# Patient Record
Sex: Female | Born: 1945 | Race: Black or African American | Hispanic: No | Marital: Married
Health system: Southern US, Community
[De-identification: ages and names within clinical notes are randomized; demographics above are authoritative.]

---

## 2014-11-08 ENCOUNTER — Other Ambulatory Visit: Payer: Self-pay

## 2014-11-08 DIAGNOSIS — Z1231 Encounter for screening mammogram for malignant neoplasm of breast: Secondary | ICD-10-CM

## 2014-11-14 ENCOUNTER — Ambulatory Visit
Admission: RE | Admit: 2014-11-14 | Discharge: 2014-11-14 | Disposition: A | Discharge status: Home / Self Care | Payer: Medicare Other | Source: Ambulatory Visit

## 2014-11-14 DIAGNOSIS — Z1231 Encounter for screening mammogram for malignant neoplasm of breast: Secondary | ICD-10-CM

## 2015-09-12 ENCOUNTER — Other Ambulatory Visit: Payer: Self-pay | Admitting: Nurse Practitioner

## 2015-09-12 DIAGNOSIS — E2839 Other primary ovarian failure: Secondary | ICD-10-CM

## 2015-10-02 ENCOUNTER — Ambulatory Visit
Admission: RE | Admit: 2015-10-02 | Discharge: 2015-10-02 | Disposition: A | Discharge status: Home / Self Care | Payer: Medicare Other | Source: Ambulatory Visit | Attending: Nurse Practitioner | Admitting: Nurse Practitioner

## 2015-10-02 DIAGNOSIS — E2839 Other primary ovarian failure: Secondary | ICD-10-CM

## 2018-12-16 ENCOUNTER — Other Ambulatory Visit: Payer: Self-pay | Admitting: Family Medicine

## 2018-12-16 DIAGNOSIS — Z1231 Encounter for screening mammogram for malignant neoplasm of breast: Secondary | ICD-10-CM

## 2019-02-01 ENCOUNTER — Ambulatory Visit
Admission: RE | Admit: 2019-02-01 | Discharge: 2019-02-01 | Disposition: A | Discharge status: Home / Self Care | Payer: Medicare Other | Source: Ambulatory Visit | Attending: Family Medicine | Admitting: Family Medicine

## 2019-02-01 ENCOUNTER — Other Ambulatory Visit: Payer: Self-pay

## 2019-02-01 DIAGNOSIS — Z1231 Encounter for screening mammogram for malignant neoplasm of breast: Secondary | ICD-10-CM

## 2019-04-21 ENCOUNTER — Ambulatory Visit: Payer: Medicare Other

## 2019-04-29 ENCOUNTER — Ambulatory Visit: Payer: Medicare Other | Attending: Internal Medicine

## 2019-04-29 DIAGNOSIS — Z23 Encounter for immunization: Secondary | ICD-10-CM | POA: Insufficient documentation

## 2019-04-29 NOTE — Progress Notes (Signed)
   Covid-19 Vaccination Clinic  Name:  Sharon Carpenter    MRN: [id number] DOB: 1945/04/04  04/29/2019  [name] was observed post Covid-19 immunization for 15 minutes without incidence. She was provided with Vaccine Information Sheet and instruction to access the V-Safe system.   [name] was instructed to call 911 with any severe reactions post vaccine: Marland Kitchen Difficulty breathing  . Swelling of your face and throat  . A fast heartbeat  . A bad rash all over your body  . Dizziness and weakness    Immunizations Administered    Name Date Dose VIS Date Route   Pfizer COVID-19 Vaccine 04/29/2019  3:40 PM 0.3 mL 03/04/2019 Intramuscular   Manufacturer: [employer], [employer]   Lot: [id number]   NDC: [address]-9

## 2019-05-02 ENCOUNTER — Ambulatory Visit: Payer: Medicare Other

## 2019-05-23 ENCOUNTER — Ambulatory Visit: Payer: Medicare Other | Attending: Internal Medicine

## 2019-05-23 DIAGNOSIS — Z23 Encounter for immunization: Secondary | ICD-10-CM | POA: Insufficient documentation

## 2019-05-23 NOTE — Progress Notes (Signed)
   Covid-19 Vaccination Clinic  Name:  Sharon Carpenter    MRN: [id number] DOB: 1945/10/09  05/23/2019  Sharon Carpenter was observed post Covid-19 immunization for 15 minutes without incidence. She was provided with Vaccine Information Sheet and instruction to access the V-Safe system.   Sharon Carpenter was instructed to call 911 with any severe reactions post vaccine: Marland Kitchen Difficulty breathing  . Swelling of your face and throat  . A fast heartbeat  . A bad rash all over your body  . Dizziness and weakness    Immunizations Administered    Name Date Dose VIS Date Route   Pfizer COVID-19 Vaccine 05/23/2019 11:15 AM 0.3 mL 03/04/2019 Intramuscular   Manufacturer: [employer], [employer]   Lot: [id number]   NDC: [address]-0

## 2019-05-24 ENCOUNTER — Ambulatory Visit: Payer: Medicare Other

## 2020-09-19 IMAGING — MG DIGITAL SCREENING BILAT W/ TOMO W/ CAD
8 series · 8 of 24 positions shown · non-contrast
Comparison: Previous exam(s).

CLINICAL DATA: Screening.

EXAM:
DIGITAL SCREENING BILATERAL MAMMOGRAM WITH TOMO AND CAD

[R CC synth-2D]
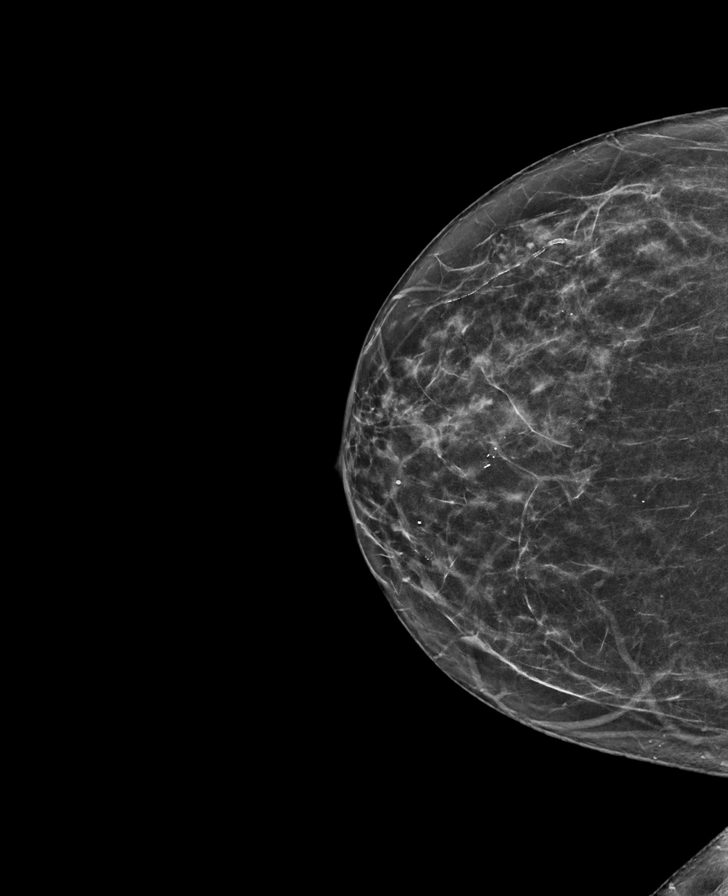

[L CC synth-2D]
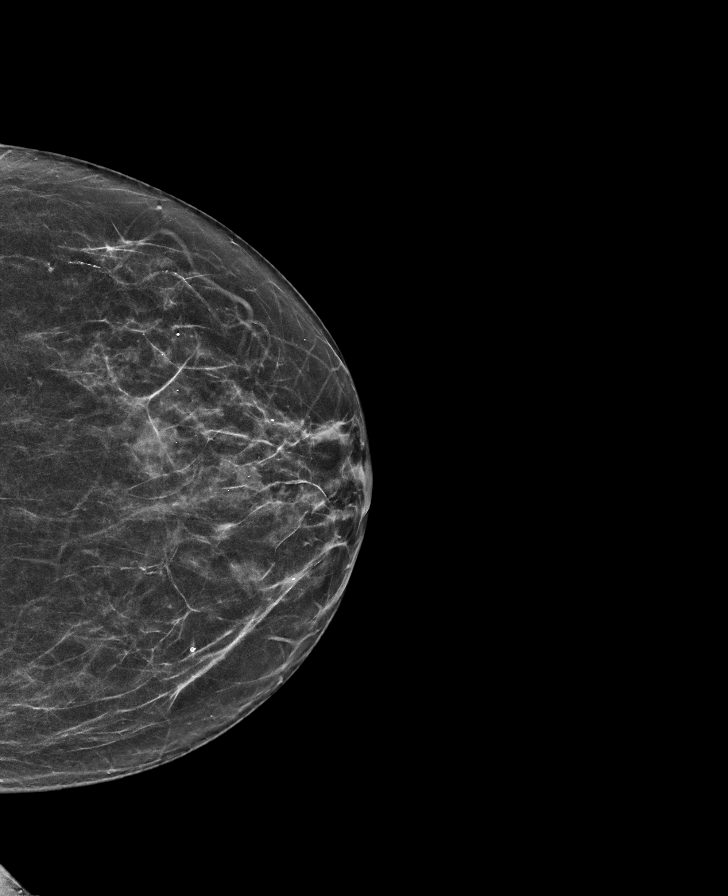

[L MLO synth-2D]
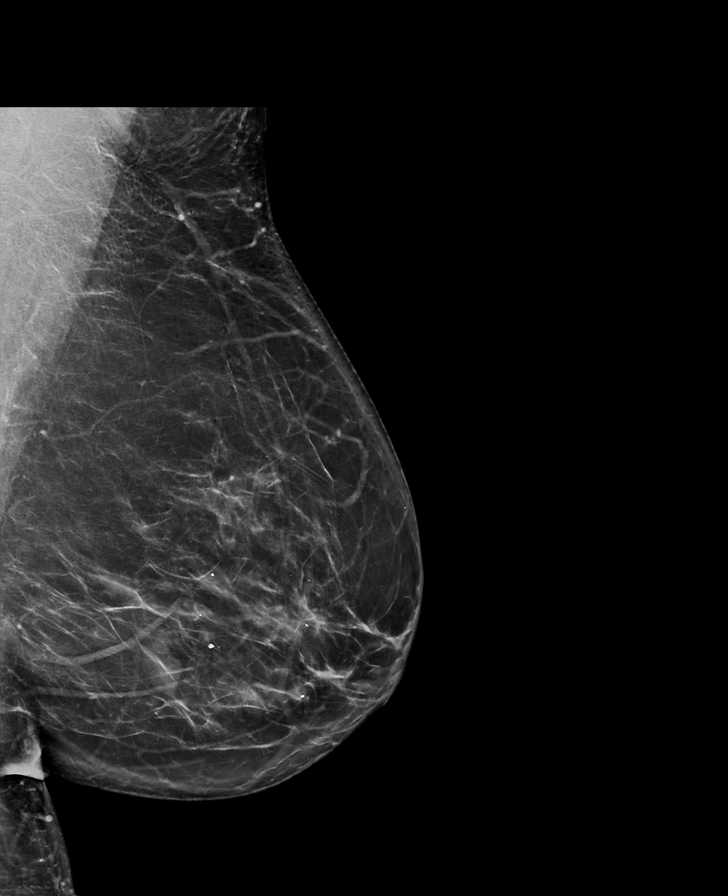

[R MLO synth-2D]
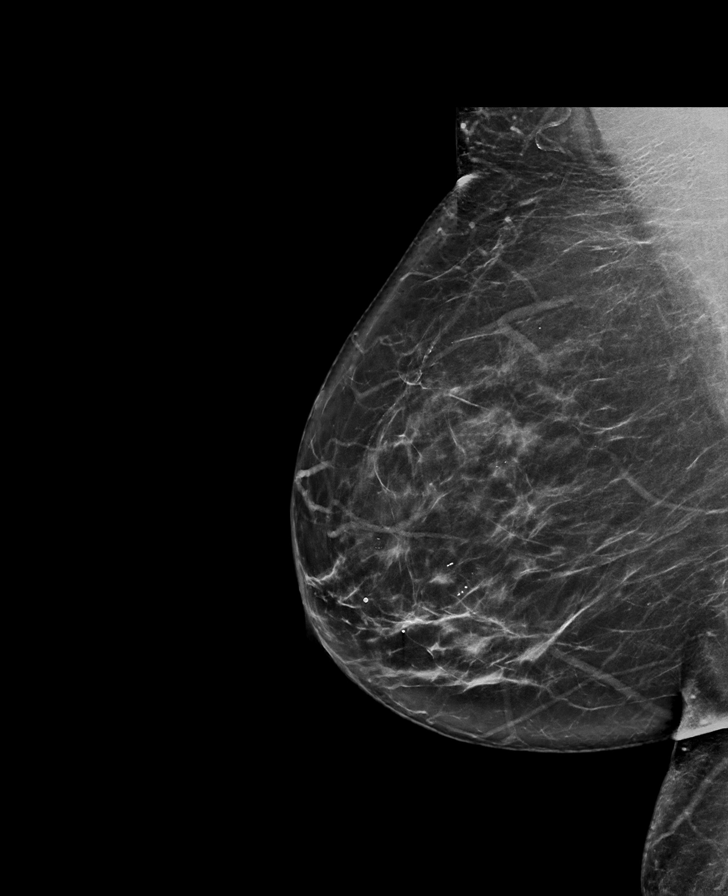

[R CC tomo · tomo slice 33/64.0]
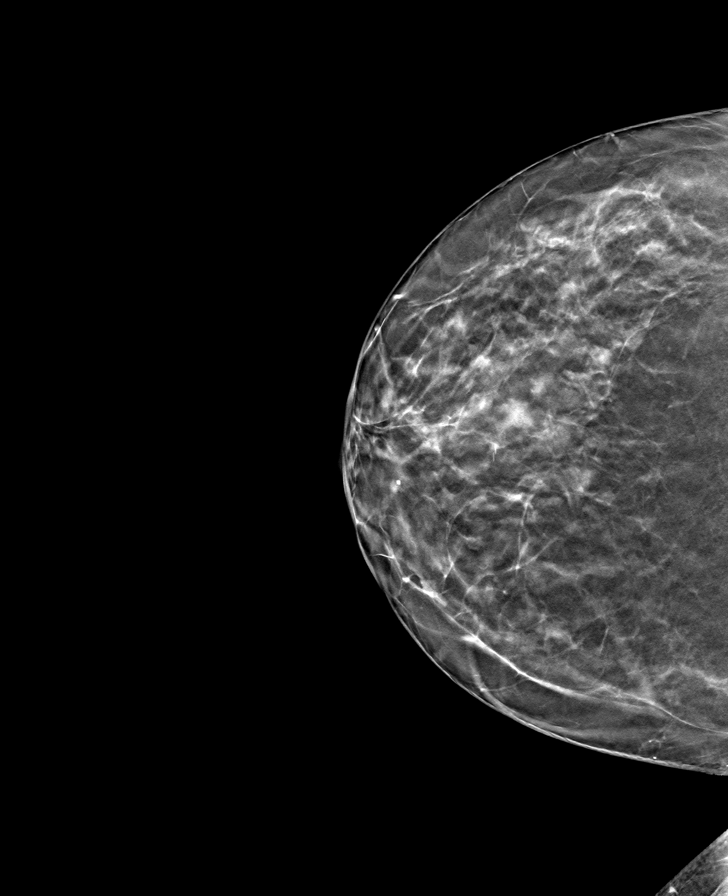

[L CC tomo · tomo slice 33/64.0]
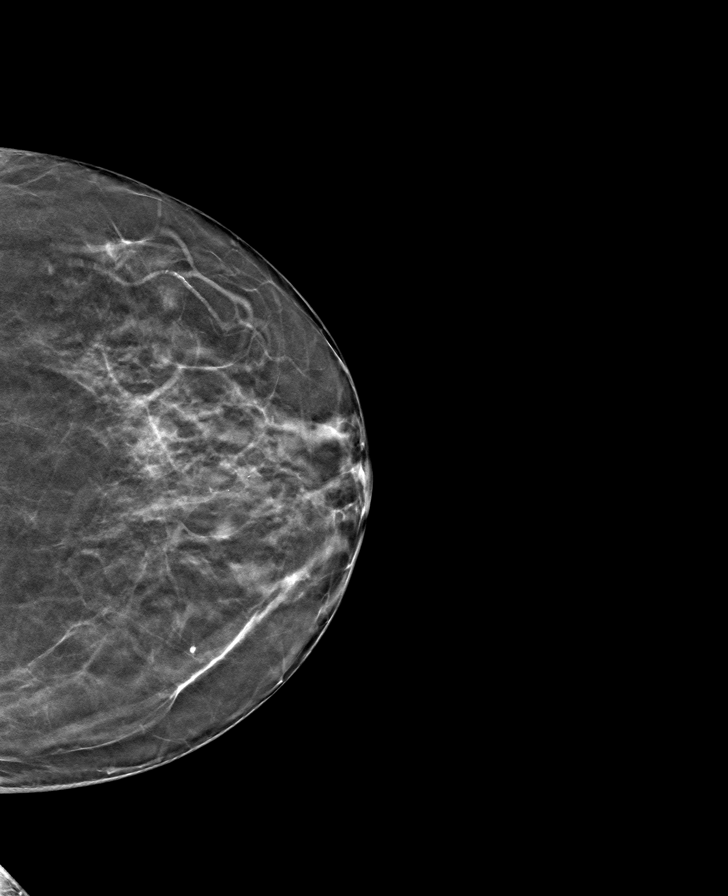

[L MLO tomo · tomo slice 42/83.0]
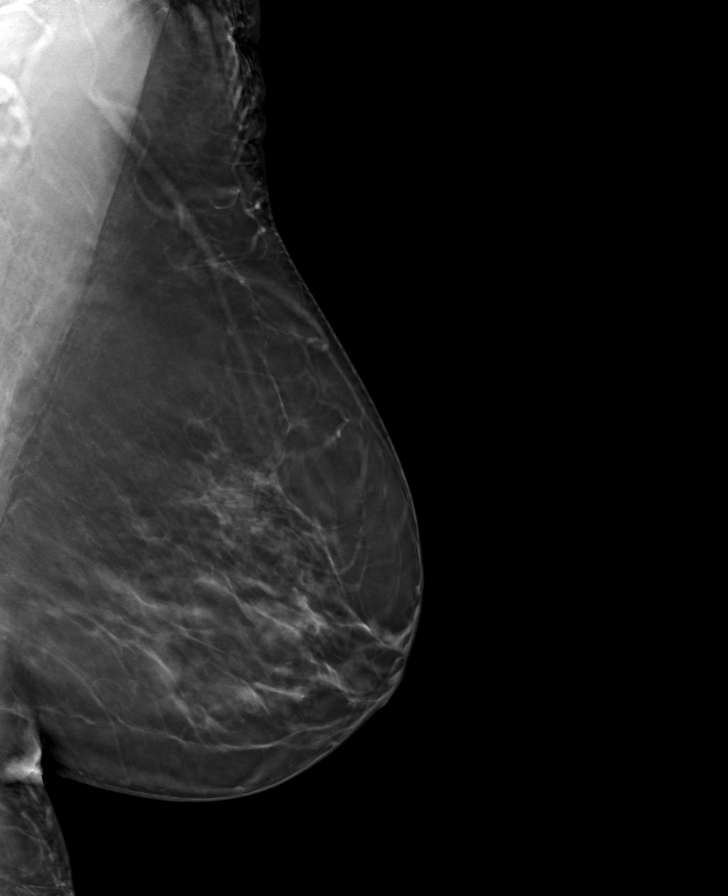

[R MLO tomo · tomo slice 41/81.0]
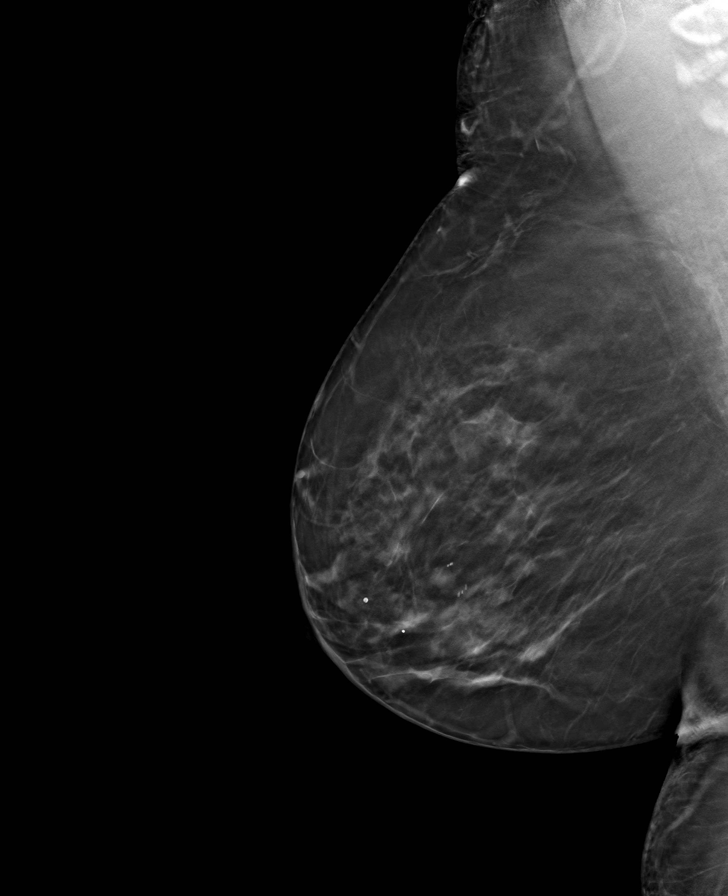

[8 of 24 positions shown; findings below may reference images not displayed]

ACR Breast Density Category b: There are scattered areas of
fibroglandular density.
FINDINGS: There are no findings suspicious for malignancy. Images were
processed with CAD.
IMPRESSION: No mammographic evidence of malignancy. A result letter of this
screening mammogram will be mailed directly to the patient.

RECOMMENDATION:
Screening mammogram in one year. (Code:[id number])

BI-RADS CATEGORY  1: Negative.

## 2021-01-07 ENCOUNTER — Other Ambulatory Visit: Payer: Self-pay | Admitting: Nurse Practitioner

## 2021-01-07 DIAGNOSIS — E2839 Other primary ovarian failure: Secondary | ICD-10-CM

## 2022-02-21 ENCOUNTER — Other Ambulatory Visit: Payer: Self-pay | Admitting: Family Medicine

## 2022-02-21 DIAGNOSIS — Z1231 Encounter for screening mammogram for malignant neoplasm of breast: Secondary | ICD-10-CM
